# Patient Record
Sex: Male | Born: 1990 | Race: White | Hispanic: No | Marital: Married | Smoking: Current some day smoker
Health system: Southern US, Community
[De-identification: ages and names within clinical notes are randomized; demographics above are authoritative.]

## PROBLEM LIST (undated history)

## (undated) DIAGNOSIS — F32A Depression, unspecified: Secondary | ICD-10-CM

## (undated) DIAGNOSIS — F909 Attention-deficit hyperactivity disorder, unspecified type: Secondary | ICD-10-CM

## (undated) HISTORY — DX: Depression, unspecified: F32.A

## (undated) HISTORY — DX: Attention-deficit hyperactivity disorder, unspecified type: F90.9

## (undated) HISTORY — PX: WISDOM TOOTH EXTRACTION: SHX21

---

## 2017-11-04 ENCOUNTER — Other Ambulatory Visit: Payer: Self-pay | Admitting: Family Medicine

## 2017-11-04 DIAGNOSIS — K4090 Unilateral inguinal hernia, without obstruction or gangrene, not specified as recurrent: Secondary | ICD-10-CM

## 2017-11-10 ENCOUNTER — Other Ambulatory Visit: Payer: Self-pay

## 2017-11-12 ENCOUNTER — Ambulatory Visit
Admission: RE | Admit: 2017-11-12 | Discharge: 2017-11-12 | Disposition: A | Discharge status: Home / Self Care | Payer: 59 | Source: Ambulatory Visit | Attending: Family Medicine | Admitting: Family Medicine

## 2017-11-12 DIAGNOSIS — K4090 Unilateral inguinal hernia, without obstruction or gangrene, not specified as recurrent: Secondary | ICD-10-CM

## 2017-11-12 MED ORDER — IOPAMIDOL (ISOVUE-300) INJECTION 61%
125.0000 mL | Freq: Once | INTRAVENOUS | Status: AC | PRN
  Administered 2017-11-12: 125 mL via INTRAVENOUS

## 2018-04-23 ENCOUNTER — Ambulatory Visit
Admission: RE | Admit: 2018-04-23 | Discharge: 2018-04-23 | Disposition: A | Discharge status: Home / Self Care | Payer: No Typology Code available for payment source | Source: Ambulatory Visit | Attending: Nurse Practitioner | Admitting: Nurse Practitioner

## 2018-04-23 ENCOUNTER — Other Ambulatory Visit: Payer: Self-pay | Admitting: Nurse Practitioner

## 2018-04-23 DIAGNOSIS — Z021 Encounter for pre-employment examination: Secondary | ICD-10-CM

## 2019-09-02 ENCOUNTER — Other Ambulatory Visit: Payer: Self-pay

## 2019-09-02 DIAGNOSIS — Z20822 Contact with and (suspected) exposure to covid-19: Secondary | ICD-10-CM

## 2019-09-04 LAB — NOVEL CORONAVIRUS, NAA: SARS-CoV-2, NAA: NOT DETECTED

## 2019-10-22 IMAGING — CT CT ABD-PELV W/ CM
3 of 4 series · 12 of 36 positions shown, 19 images · IV contrast (READICAT/WATER & [ID] ISOVUE 300)
Comparison: None

CLINICAL DATA: RIGHT groin pain for 1.5 weeks question hernia,
history constipation

EXAM:
CT ABDOMEN AND PELVIS WITH CONTRAST
TECHNIQUE: Multidetector CT imaging of the abdomen and pelvis was performed
using the standard protocol following bolus administration of
intravenous contrast. Sagittal and coronal MPR images reconstructed
from axial data set.
CONTRAST:  Dilute oral contrast.  100 cc Rsovue-PPP IV.

[Series 3: abd/pelvis with · axial · 0.78mm/px · z∈[-414,-34]mm · 9 of 94 slices shown, 15 images]
[im 10/94  soft-tissue]
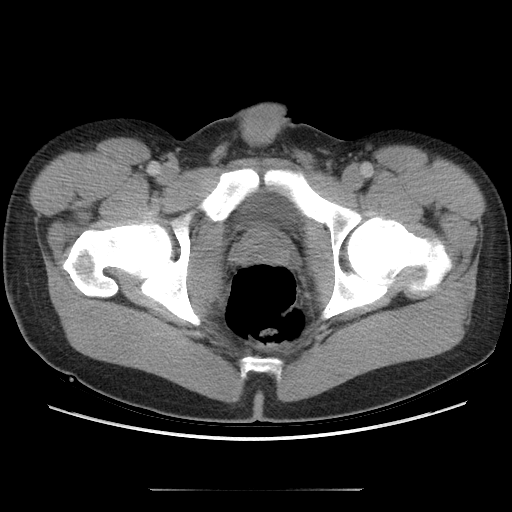
[im 10/94  bone]
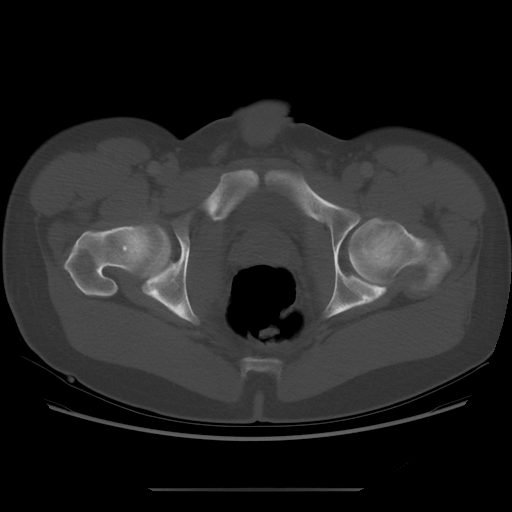
[im 19/94  soft-tissue]
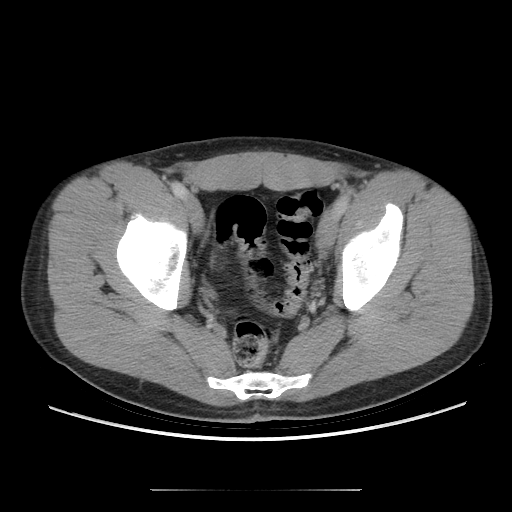
[im 28/94  soft-tissue]
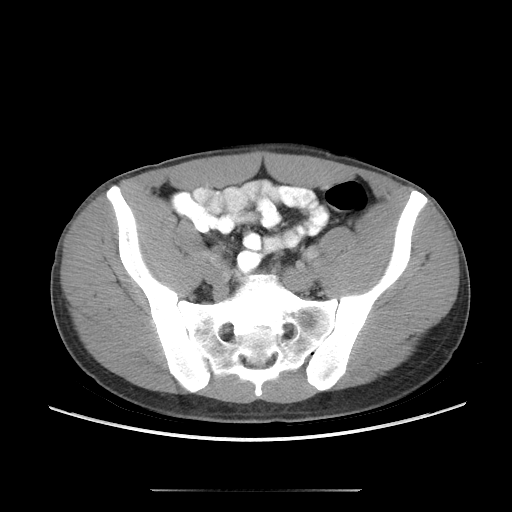
[im 38/94  soft-tissue]
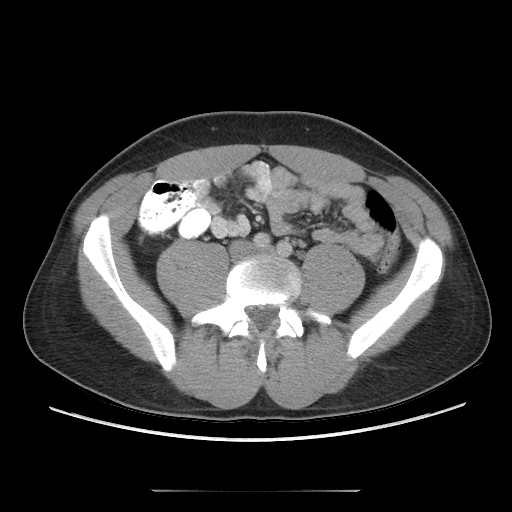
[im 47/94  soft-tissue]
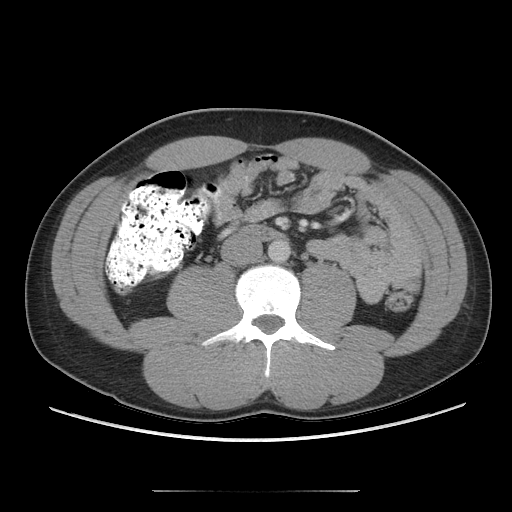
[im 56/94  soft-tissue]
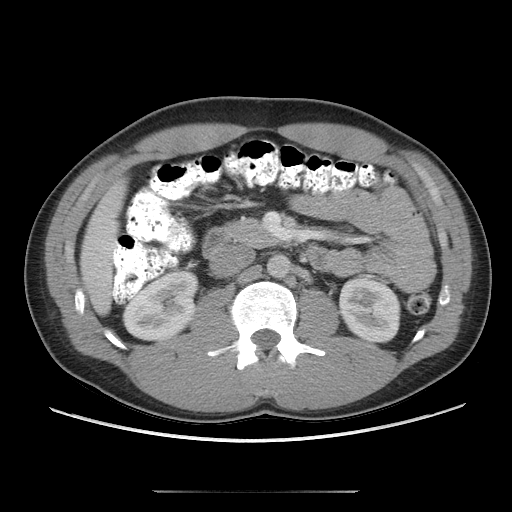
[im 56/94  lung]
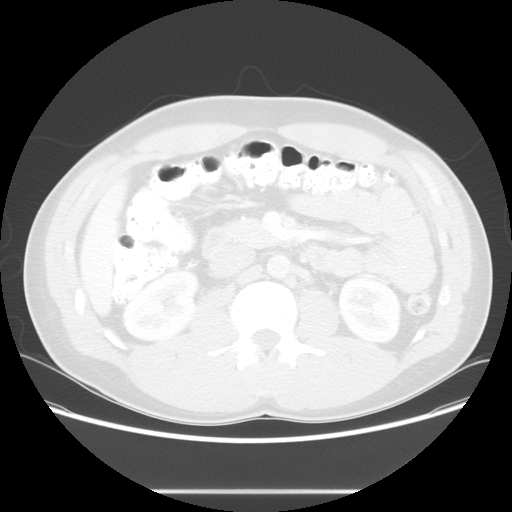
[im 66/94  soft-tissue]
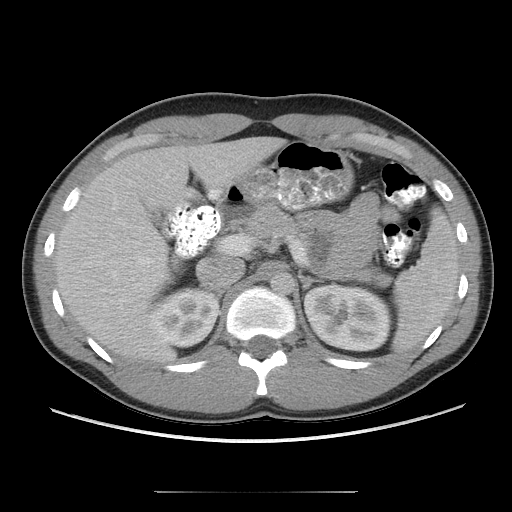
[im 66/94  lung]
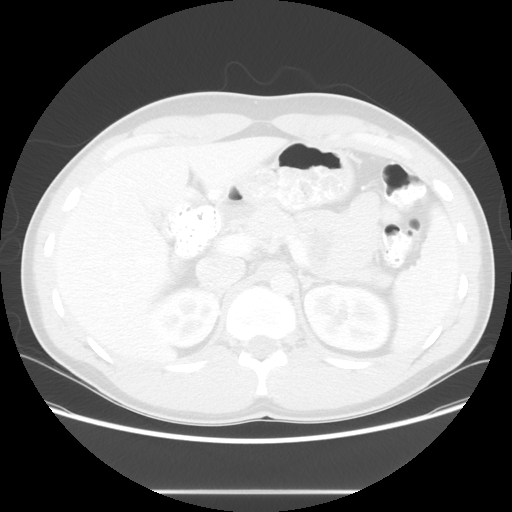
[im 75/94  soft-tissue]
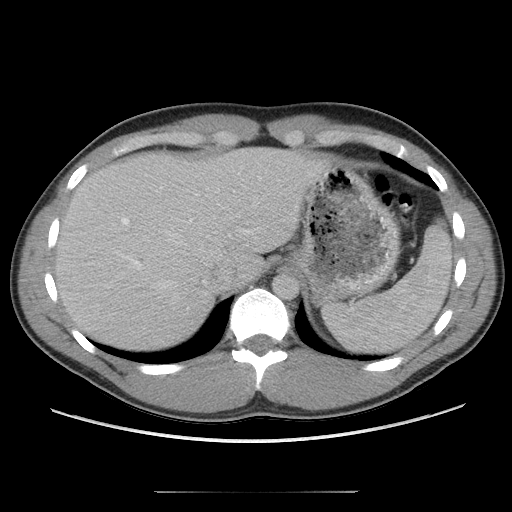
[im 75/94  lung]
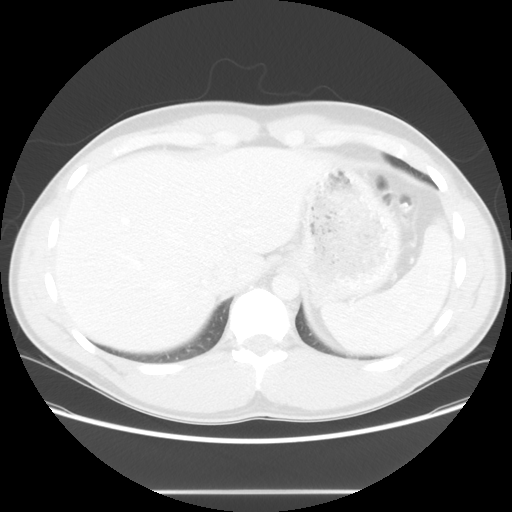
[im 84/94  soft-tissue]
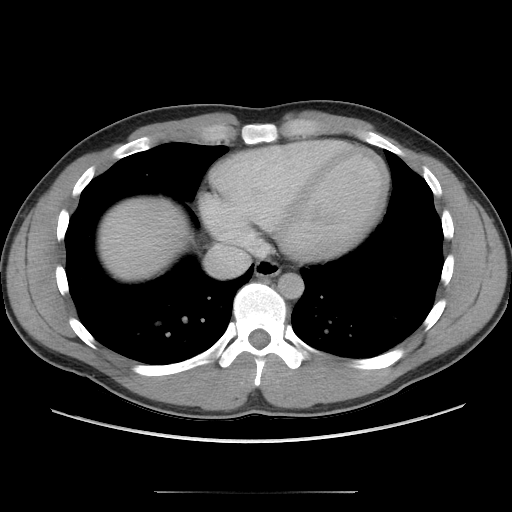
[im 84/94  lung]
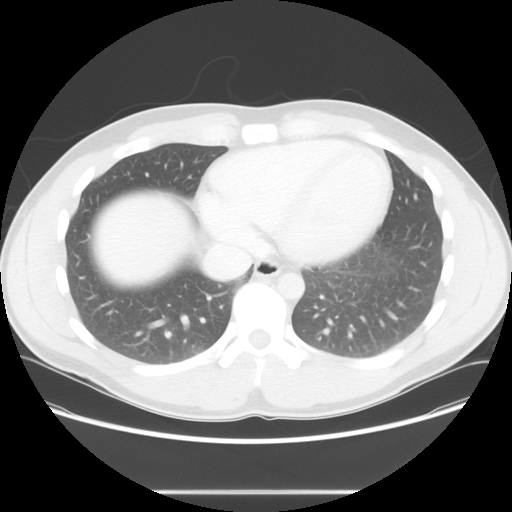
[im 84/94  bone]
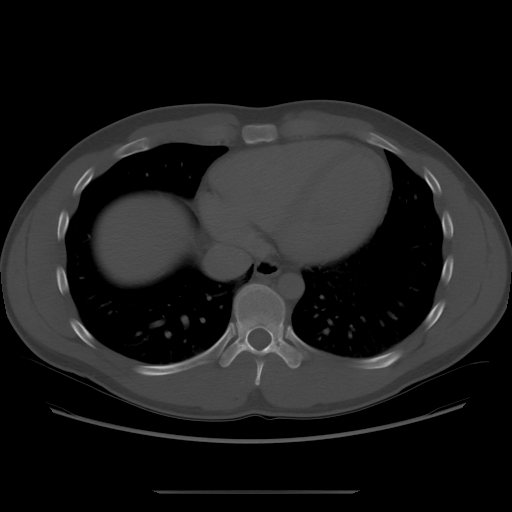

[Series 601: coronal body · coronal · 0.95mm/px · 1 of 112 slices shown, 2 images]
[im 38/112  soft-tissue]
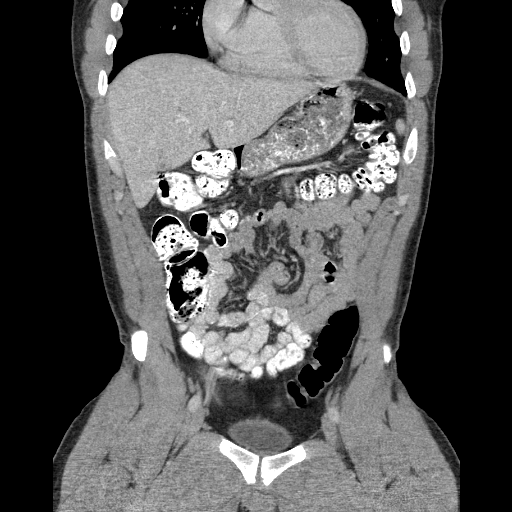
[im 38/112  bone]
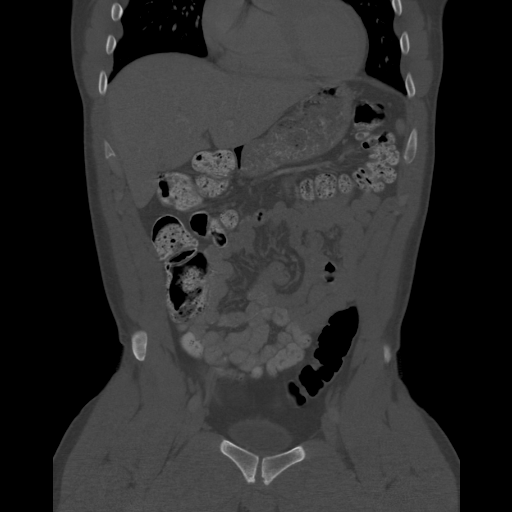

[Series 602: sagittal body · sagittal · 0.95mm/px · 2 of 161 slices shown]
[im 19/161  soft-tissue]
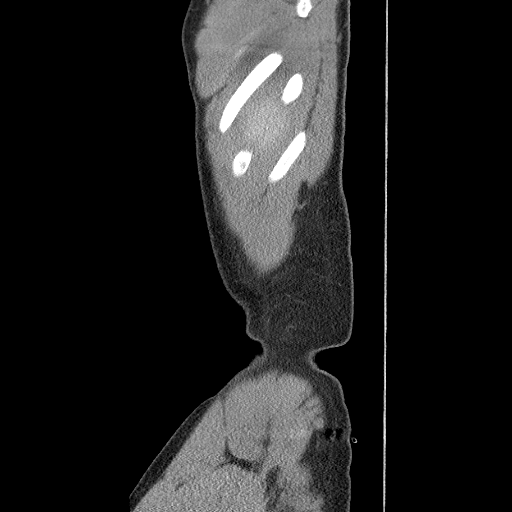
[im 38/161  soft-tissue]
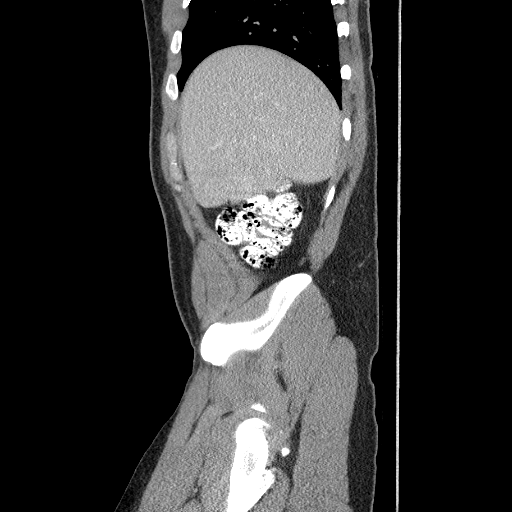

[12 of 36 positions shown; findings below may reference images not displayed]

FINDINGS: Lower chest: Lung bases clear

Hepatobiliary: Contracted gallbladder.  Liver normal appearance.

Pancreas: Normal appearance

Spleen: Normal appearance

Adrenals/Urinary Tract: Adrenal glands normal appearance. Kidneys,
ureters and bladder normal appearance

Stomach/Bowel: Stomach contains food debris and minimal contrast.
Normal appendix. Large and small bowel loops normal appearance.

Vascular/Lymphatic: Vascular structures unremarkable. No adenopathy.

Reproductive: Unremarkable prostate gland and seminal vesicles

Other: No free air or free fluid. Tiny umbilical hernia containing
fat. No definite inguinal hernia seen. No acute inflammatory
process.

Musculoskeletal: Osseous structures unremarkable. Muscular planes
symmetric.
IMPRESSION: Tiny umbilical hernia containing fat.

Otherwise normal exam.

## 2019-11-08 DIAGNOSIS — R229 Localized swelling, mass and lump, unspecified: Secondary | ICD-10-CM | POA: Insufficient documentation

## 2020-03-31 IMAGING — CR DG CHEST 1V
1 series · 1 of 1 positions shown · non-contrast
Comparison: None.

CLINICAL DATA: Pre-employment physical examination

EXAM:
CHEST  1 VIEW

[w chest pa]
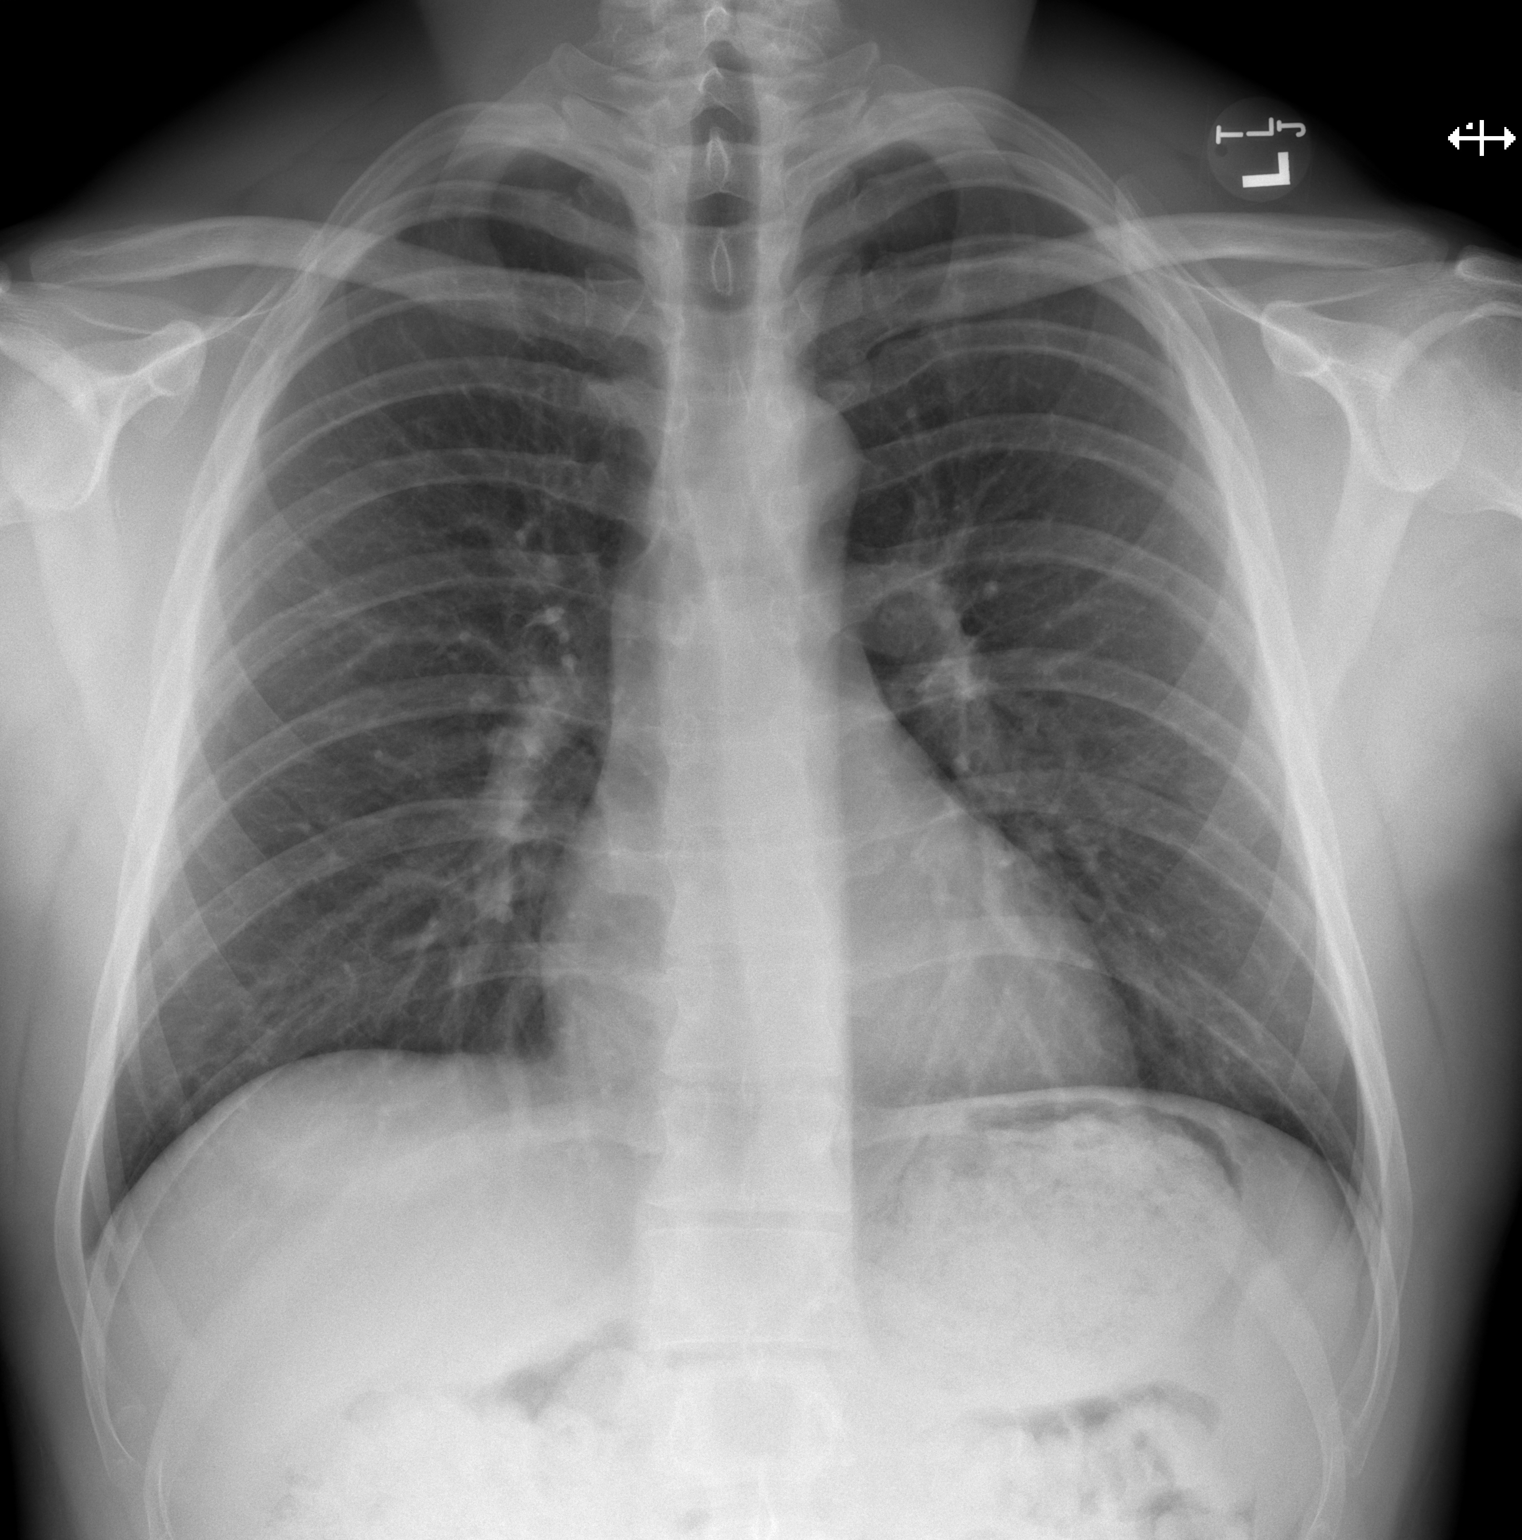

[1 of 1 positions shown; findings below may reference images not displayed]

FINDINGS: Lungs are clear. Heart size and pulmonary vascularity are normal. No
adenopathy. No bone lesions.
IMPRESSION: No abnormality noted.

## 2022-03-13 ENCOUNTER — Ambulatory Visit: Payer: 59 | Admitting: Family

## 2022-03-13 ENCOUNTER — Encounter: Payer: Self-pay | Admitting: Family

## 2022-03-13 VITALS — BP 122/66 | HR 60 | Temp 98.7°F | Ht 74.0 in | Wt 217.0 lb

## 2022-03-13 DIAGNOSIS — R5383 Other fatigue: Secondary | ICD-10-CM

## 2022-03-13 DIAGNOSIS — Z0000 Encounter for general adult medical examination without abnormal findings: Secondary | ICD-10-CM | POA: Diagnosis not present

## 2022-03-13 NOTE — Patient Instructions (Addendum)
Welcome to [facility], It was a pleasure meeting you today!  Go to the lab for blood work today.   Schedule your testosterone level for one morning before 9am, non-fasting. See the attached handout regarding how to fight fatigue.  Have a great rest of the week!     PLEASE NOTE: If you had any LAB tests please let us know if you have not heard back within a few days. You may see your results on MyChart before we have a chance to review them but we will give you a call once they are reviewed by [place]. If we ordered any REFERRALS today, please let us know if you have not heard from their office within the next week.  Let us know through MyChart if you are needing REFILLS, or have your pharmacy send [place] the request. You can also use MyChart to communicate with me or any office staff.

## 2022-03-13 NOTE — Progress Notes (Signed)
3 Phone: [phone]   Subjective:  Patient 31 y.o. male presenting for annual physical.  Chief Complaint  Patient presents with   Annual Exam    Not fasting W/ labs. Would like to check Testosterone   Fatigue    Pt c/o fatigue and no energy some days even on his days off. Present for a year and a half or 2 years.     See problem oriented charting- ROS- full  review of systems was completed and negative   The following were reviewed and entered/updated in epic: Past Medical History:  Diagnosis Date   ADHD    Depression    Patient Active Problem List   Diagnosis Date Noted   Subcutaneous mass 11/08/2019   Past Surgical History:  Procedure Laterality Date   WISDOM TOOTH EXTRACTION      History reviewed. No pertinent family history.  Medications- reviewed and updated Current Outpatient Medications  Medication Sig Dispense Refill   CONCERTA 54 MG CR tablet Take 54 mg by mouth every morning.     methylphenidate (RITALIN) 10 MG tablet Take 10 mg by mouth daily.     No current facility-administered medications for this visit.    Allergies-reviewed and updated No Known Allergies  Social History   Social History Narrative   Not on file   Objective  Objective:  BP 122/66 (BP Location: Left Arm, Patient Position: Sitting, Cuff Size: Large)   Pulse 60   Temp 98.7 F (37.1 C) (Temporal)   Ht 6\' 2"  (1.88 m)   Wt 217 lb (98.4 kg)   SpO2 99%   BMI 27.86 kg/m  Physical Exam Vitals and nursing note reviewed.  Constitutional:      General: He is not in acute distress.    Appearance: Normal appearance.  HENT:     Head: Normocephalic.     Right Ear: Tympanic membrane and external ear normal.     Left Ear: Tympanic membrane and external ear normal.     Nose: Nose normal.     Mouth/Throat:     Mouth: Mucous membranes are moist.  Eyes:     Extraocular Movements: Extraocular movements intact.  Cardiovascular:     Rate and Rhythm: Regular rhythm. Bradycardia  present.  Pulmonary:     Effort: Pulmonary effort is normal.     Breath sounds: Normal breath sounds.  Abdominal:     General: Abdomen is flat. There is no distension.     Palpations: Abdomen is soft.     Tenderness: There is no abdominal tenderness.  Musculoskeletal:        General: Normal range of motion.     Cervical back: Normal range of motion.  Skin:    General: Skin is warm and dry.  Neurological:     Mental Status: He is alert and oriented to person, place, and time.  Psychiatric:        Mood and Affect: Mood normal.        Behavior: Behavior normal.        Judgment: Judgment normal.     Assessment and Plan   Health Maintenance counseling: 1. Anticipatory guidance: Patient counseled regarding regular dental exams q6 months, eye exams yearly, avoiding smoking and second hand smoke, limiting alcohol to 2 beverages per day.   2. Risk factor reduction:  Advised patient of need for regular exercise and diet rich in fruits and vegetables to reduce risk of heart attack and stroke.    Wt Readings from Last 3 Encounters:  03/13/22 217 lb (98.4 kg)   3. Immunizations/screenings/ancillary studies Immunization History  Administered Date(s) Administered   Hepatitis B, adult 07/30/2017   PPD Test 07/30/2017   Health Maintenance Due  Topic Date Due   COVID-19 Vaccine (1) Never done   HIV Screening  Never done   Hepatitis C Screening  Never done   TETANUS/TDAP  Never done    4. Skin cancer screening-  advised regular sunscreen use. Denies worrisome, changing, or new skin lesions.  5. Smoking associated screening: non- smoker  6. STD screening - N/A 7. Alcohol screening: 3-4 per week   Problem List Items Addressed This Visit   None Visit Diagnoses     Annual physical exam    -  Primary   Relevant Orders   Comprehensive metabolic panel   TSH   Lipid panel   CBC with Differential/Platelet   Other fatigue    - exercises most days, sleeping well, works physical job,  drinks large amount of caffeine qd in addition to taking stimulant medication, advised on reducing. Low heart rate, 54bpm during exam, denies dizziness or lightheadedness, but d/t age, exercise will continue to monitor.    Relevant Orders   Vitamin D (25 hydroxy)     Recommended follow up: Return for lab only appointment before 9am for testosterone check. No future appointments.   Lab/Order associations: non- fasting   Jeanie Sewer, NP

## 2022-03-14 ENCOUNTER — Other Ambulatory Visit: Payer: 59

## 2022-03-14 ENCOUNTER — Other Ambulatory Visit: Payer: Self-pay

## 2022-03-14 DIAGNOSIS — R5383 Other fatigue: Secondary | ICD-10-CM

## 2022-03-14 LAB — CBC WITH DIFFERENTIAL/PLATELET
Basophils Absolute: 0.1 10*3/uL (ref 0.0–0.1)
Basophils Relative: 1 % (ref 0.0–3.0)
Eosinophils Absolute: 0.3 10*3/uL (ref 0.0–0.7)
Eosinophils Relative: 5.7 % — ABNORMAL HIGH (ref 0.0–5.0)
HCT: 41.9 % (ref 39.0–52.0)
Hemoglobin: 14.5 g/dL (ref 13.0–17.0)
Lymphocytes Relative: 30.2 % (ref 12.0–46.0)
Lymphs Abs: 1.7 10*3/uL (ref 0.7–4.0)
MCHC: 34.7 g/dL (ref 30.0–36.0)
MCV: 89.3 fl (ref 78.0–100.0)
Monocytes Absolute: 0.5 10*3/uL (ref 0.1–1.0)
Monocytes Relative: 8.3 % (ref 3.0–12.0)
Neutro Abs: 3.1 10*3/uL (ref 1.4–7.7)
Neutrophils Relative %: 54.8 % (ref 43.0–77.0)
Platelets: 232 10*3/uL (ref 150.0–400.0)
RBC: 4.69 Mil/uL (ref 4.22–5.81)
RDW: 12.9 % (ref 11.5–15.5)
WBC: 5.6 10*3/uL (ref 4.0–10.5)

## 2022-03-14 LAB — LIPID PANEL
Cholesterol: 177 mg/dL (ref 0–200)
HDL: 72.1 mg/dL (ref >39.00)
LDL Cholesterol: 87 mg/dL (ref 0–99)
NonHDL: 104.72
Total CHOL/HDL Ratio: 2
Triglycerides: 87 mg/dL (ref 0.0–149.0)
VLDL: 17.4 mg/dL (ref 0.0–40.0)

## 2022-03-14 LAB — COMPREHENSIVE METABOLIC PANEL
ALT: 25 U/L (ref 0–53)
AST: 21 U/L (ref 0–37)
Albumin: 4.7 g/dL (ref 3.5–5.2)
Alkaline Phosphatase: 39 U/L (ref 39–117)
BUN: 11 mg/dL (ref 6–23)
CO2: 28 mEq/L (ref 19–32)
Calcium: 9.6 mg/dL (ref 8.4–10.5)
Chloride: 103 mEq/L (ref 96–112)
Creatinine, Ser: 1.18 mg/dL (ref 0.40–1.50)
GFR: 83.78 mL/min (ref >60.00)
Glucose, Bld: 77 mg/dL (ref 70–99)
Potassium: 3.8 mEq/L (ref 3.5–5.1)
Sodium: 141 mEq/L (ref 135–145)
Total Bilirubin: 0.6 mg/dL (ref 0.2–1.2)
Total Protein: 7 g/dL (ref 6.0–8.3)

## 2022-03-14 LAB — TSH: TSH: 1.57 u[IU]/mL (ref 0.35–5.50)

## 2022-03-14 LAB — VITAMIN D 25 HYDROXY (VIT D DEFICIENCY, FRACTURES): VITD: 23.15 ng/mL — ABNORMAL LOW (ref 30.00–100.00)

## 2022-03-15 ENCOUNTER — Other Ambulatory Visit (INDEPENDENT_AMBULATORY_CARE_PROVIDER_SITE_OTHER): Payer: 59

## 2022-03-15 DIAGNOSIS — R5383 Other fatigue: Secondary | ICD-10-CM | POA: Diagnosis not present

## 2022-03-15 LAB — TESTOSTERONE: Testosterone: 533.72 ng/dL (ref 300.00–890.00)

## 2022-03-17 NOTE — Progress Notes (Signed)
Hi Phineas,  All of your labs are within normal range.  Glucose (sugar), electrolytes, kidney and liver function, blood count, thyroid, and cholesterol numbers are all good.  Your testosterone is also in very good range. The slight elevation in your Eosinophils just indicates you are having some mild allergy symptoms. Keep up the good work with controlling your diet and continue to try and shoot for 30 minutes of exercise daily!

## 2022-07-06 ENCOUNTER — Other Ambulatory Visit: Payer: Self-pay

## 2022-07-06 ENCOUNTER — Emergency Department (HOSPITAL_BASED_OUTPATIENT_CLINIC_OR_DEPARTMENT_OTHER): Payer: No Typology Code available for payment source | Admitting: Radiology

## 2022-07-06 DIAGNOSIS — M25512 Pain in left shoulder: Secondary | ICD-10-CM | POA: Diagnosis not present

## 2022-07-06 DIAGNOSIS — Y990 Civilian activity done for income or pay: Secondary | ICD-10-CM | POA: Diagnosis not present

## 2022-07-06 DIAGNOSIS — W19XXXA Unspecified fall, initial encounter: Secondary | ICD-10-CM | POA: Insufficient documentation

## 2022-07-06 DIAGNOSIS — M25552 Pain in left hip: Secondary | ICD-10-CM | POA: Insufficient documentation
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
## 2022-07-07 ENCOUNTER — Emergency Department (HOSPITAL_BASED_OUTPATIENT_CLINIC_OR_DEPARTMENT_OTHER)
Admission: EM | Admit: 2022-07-07 | Discharge: 2022-07-07 | Disposition: A | Discharge status: Home / Self Care | Payer: No Typology Code available for payment source | Attending: Emergency Medicine | Admitting: Emergency Medicine

## 2022-07-07 DIAGNOSIS — M25512 Pain in left shoulder: Secondary | ICD-10-CM

## 2022-07-07 DIAGNOSIS — M25552 Pain in left hip: Secondary | ICD-10-CM

## 2022-07-07 MED ORDER — KETOROLAC TROMETHAMINE 60 MG/2ML IM SOLN
30.0000 mg | Freq: Once | INTRAMUSCULAR | Status: AC
  Administered 2022-07-07: 30 mg via INTRAMUSCULAR
  Filled 2022-07-07: qty 2

## 2022-07-07 MED ORDER — NAPROXEN 375 MG PO TABS: 375.0000 mg | ORAL_TABLET | Freq: Two times a day (BID) | ORAL | 0 refills | Status: DC

## 2022-07-07 MED ORDER — LIDOCAINE 5 % EX PTCH: 1.0000 | MEDICATED_PATCH | CUTANEOUS | 0 refills | Status: AC

## 2022-07-07 NOTE — ED Provider Notes (Signed)
[facility] EMERGENCY DEPT Provider Note   CSN: [id number] Arrival date & time: 07/06/22  2123     History  Chief Complaint  Patient presents with   Fall   Knee Pain   Shoulder Injury   Work Related Injury    Walter Robinson is a 31 y.o. male.  The history is provided by the patient.  Fall This is a new problem. The current episode started 6 to 12 hours ago. The problem occurs constantly. The problem has been resolved. Pertinent negatives include no chest pain, no abdominal pain, no headaches and no shortness of breath. Nothing aggravates the symptoms. Nothing relieves the symptoms. He has tried nothing for the symptoms. The treatment provided no relief.  Knee Pain Lower extremity pain location: fall onto left shoulder and hip with referred pain to the knee. Time since incident:  7 hours Pain details:    Quality:  Aching   Severity:  Moderate   Onset quality:  Sudden   Duration:  12 hours   Timing:  Constant   Progression:  Unchanged Chronicity:  New Relieved by:  Nothing Worsened by:  Nothing Ineffective treatments:  None tried Associated symptoms: no back pain, no decreased ROM and no fatigue   Risk factors: no concern for non-accidental trauma   Shoulder Injury This is a new problem. The current episode started 6 to 12 hours ago. The problem occurs constantly. Pertinent negatives include no chest pain, no abdominal pain, no headaches and no shortness of breath. Nothing aggravates the symptoms. Nothing relieves the symptoms. He has tried nothing for the symptoms. The treatment provided no relief.       Home Medications Prior to Admission medications   Medication Sig Start Date End Date Taking? Authorizing Provider  lidocaine (LIDODERM) 5 % Place 1 patch onto the skin daily. Remove & Discard patch within 12 hours or as directed by MD 07/07/22  Yes Elah Avellino, MD  naproxen (NAPROSYN) 375 MG tablet Take 1 tablet (375 mg total) by mouth 2 (two) times  daily. 07/07/22  Yes Ilija Maxim, MD  CONCERTA 54 MG CR tablet Take 54 mg by mouth every morning. 03/05/22   [provider]  methylphenidate (RITALIN) 10 MG tablet Take 10 mg by mouth daily. 03/05/22   [provider]      Allergies    Patient has no known allergies.    Review of Systems   Review of Systems  Constitutional:  Negative for fatigue.  Eyes:  Negative for redness.  Respiratory:  Negative for shortness of breath.   Cardiovascular:  Negative for chest pain.  Gastrointestinal:  Negative for abdominal pain.  Musculoskeletal:  Negative for back pain.  Neurological:  Negative for headaches.  All other systems reviewed and are negative.   Physical Exam Updated Vital Signs BP 121/64   Pulse 63   Temp 97.7 F (36.5 C) (Oral)   Resp 16   Ht 6\' 2"  (1.88 m)   Wt 97.5 kg   SpO2 100%   BMI 27.60 kg/m  Physical Exam Vitals and nursing note reviewed.  Constitutional:      General: He is not in acute distress.    Appearance: Normal appearance. He is well-developed. He is not diaphoretic.  HENT:     Head: Normocephalic and atraumatic.     Nose: Nose normal.  Eyes:     Conjunctiva/sclera: Conjunctivae normal.     Pupils: Pupils are equal, round, and reactive to light.  Cardiovascular:  Rate and Rhythm: Normal rate and regular rhythm.  Pulmonary:     Effort: Pulmonary effort is normal.     Breath sounds: Normal breath sounds. No wheezing or rales.  Abdominal:     General: Bowel sounds are normal.     Palpations: Abdomen is soft.     Tenderness: There is no abdominal tenderness. There is no guarding or rebound.  Musculoskeletal:        General: Normal range of motion.     Left shoulder: Normal.     Left upper arm: Normal.     Left elbow: Normal.     Left forearm: Normal.     Left wrist: Normal. No snuff box tenderness.     Cervical back: Normal, normal range of motion and neck supple.     Thoracic back: Normal.     Lumbar back: Normal.      Left hip: Normal.     Left knee: Normal. No LCL laxity, MCL laxity, ACL laxity or PCL laxity.Normal meniscus and normal patellar mobility. Normal pulse.     Instability Tests: Anterior drawer test negative. Posterior drawer test negative.  Skin:    General: Skin is warm and dry.  Neurological:     Mental Status: He is alert and oriented to person, place, and time.     ED Results / Procedures / Treatments   Labs (all labs ordered are listed, but only abnormal results are displayed) Labs Reviewed - No data to display  EKG None  Radiology DG Shoulder Left Port  Result Date: 07/06/2022 CLINICAL DATA:  Shoulder pain after fall on pavement EXAM: LEFT SHOULDER COMPARISON:  None Available. FINDINGS: There is no evidence of fracture or dislocation. There is no evidence of arthropathy or other focal bone abnormality. Soft tissues are unremarkable. IMPRESSION: Negative. Electronically Signed   By: Minerva Fester M.D.   On: 07/06/2022 22:04   DG Knee Complete 4 Views Left  Result Date: 07/06/2022 CLINICAL DATA:  Knee pain after fall on pavement EXAM: LEFT KNEE - COMPLETE 4+ VIEW COMPARISON:  None Available. FINDINGS: No evidence of fracture, dislocation, or joint effusion. No evidence of arthropathy or other focal bone abnormality. Soft tissues are unremarkable. IMPRESSION: Negative. Electronically Signed   By: Minerva Fester M.D.   On: 07/06/2022 22:03   DG Hip Unilat With Pelvis 2-3 Views Left  Result Date: 07/06/2022 CLINICAL DATA:  Hip pain after fall on pavement EXAM: DG HIP (WITH OR WITHOUT PELVIS) 2-3V LEFT COMPARISON:  None Available. FINDINGS: There is no evidence of hip fracture or dislocation. There is no evidence of arthropathy or other focal bone abnormality. IMPRESSION: Negative. Electronically Signed   By: Minerva Fester M.D.   On: 07/06/2022 22:03    Procedures Procedures    Medications Ordered in ED Medications  ketorolac (TORADOL) injection 30 mg (30 mg Intramuscular  Given 07/07/22 0421)    ED Course/ Medical Decision Making/ A&P                           Medical Decision Making Patient who fell running after someone   Amount and/or Complexity of Data Reviewed Independent Historian: friend    Details: See above  External Data Reviewed: notes.    Details: Previous notes reviewed  Radiology: ordered and independent interpretation performed.    Details: Xray negative by me   Risk Prescription drug management. Risk Details: Negative Neers test of the right shoulder. Walking on the hip.  No bruising.  Ice, NSAIDs alternating with tylenol.  Stable for discharge strict return precautions.      Final Clinical Impression(s) / ED Diagnoses Final diagnoses:  Pain of left hip  Acute pain of left shoulder   Return for intractable cough, coughing up blood, fevers > 100.4 unrelieved by medication, shortness of breath, intractable vomiting, chest pain, shortness of breath, weakness, numbness, changes in speech, facial asymmetry, abdominal pain, passing out, Inability to tolerate liquids or food, cough, altered mental status or any concerns. No signs of systemic illness or infection. The patient is nontoxic-appearing on exam and vital signs are within normal limits.  I have reviewed the triage vital signs and the nursing notes. Pertinent labs & imaging results that were available during my care of the patient were reviewed by me and considered in my medical decision making (see chart for details). After history, exam, and medical workup I feel the patient has been appropriately medically screened and is safe for discharge home. Pertinent diagnoses were discussed with the patient. Patient was given return precautions.  Rx / DC Orders ED Discharge Orders          Ordered    naproxen (NAPROSYN) 375 MG tablet  2 times daily        07/07/22 0436    lidocaine (LIDODERM) 5 %  Every 24 hours        07/07/22 0436              Nieko Clarin, MD 07/07/22  0973

## 2022-07-08 ENCOUNTER — Encounter: Payer: Self-pay | Admitting: *Deleted

## 2022-09-26 ENCOUNTER — Encounter: Payer: Self-pay | Admitting: *Deleted

## 2023-05-17 ENCOUNTER — Other Ambulatory Visit: Payer: Self-pay

## 2023-05-17 ENCOUNTER — Emergency Department (HOSPITAL_COMMUNITY)
Admission: EM | Admit: 2023-05-17 | Discharge: 2023-05-17 | Disposition: A | Discharge status: Home / Self Care | Payer: Worker's Compensation | Attending: Emergency Medicine | Admitting: Emergency Medicine

## 2023-05-17 ENCOUNTER — Encounter (HOSPITAL_COMMUNITY): Payer: Self-pay

## 2023-05-17 DIAGNOSIS — S59911A Unspecified injury of right forearm, initial encounter: Secondary | ICD-10-CM | POA: Diagnosis present

## 2023-05-17 DIAGNOSIS — W503XXA Accidental bite by another person, initial encounter: Secondary | ICD-10-CM | POA: Diagnosis not present

## 2023-05-17 DIAGNOSIS — S51801A Unspecified open wound of right forearm, initial encounter: Secondary | ICD-10-CM | POA: Diagnosis not present

## 2023-05-17 DIAGNOSIS — Z7721 Contact with and (suspected) exposure to potentially hazardous body fluids: Secondary | ICD-10-CM | POA: Insufficient documentation

## 2023-05-17 LAB — COMPREHENSIVE METABOLIC PANEL
ALT: 38 U/L (ref 0–44)
AST: 26 U/L (ref 15–41)
Albumin: 4.4 g/dL (ref 3.5–5.0)
Alkaline Phosphatase: 42 U/L (ref 38–126)
Anion gap: 12 (ref 5–15)
BUN: 13 mg/dL (ref 6–20)
CO2: 22 mmol/L (ref 22–32)
Calcium: 9.1 mg/dL (ref 8.9–10.3)
Chloride: 101 mmol/L (ref 98–111)
Creatinine, Ser: 1.41 mg/dL — ABNORMAL HIGH (ref 0.61–1.24)
GFR, Estimated: >60 mL/min (ref >60)
Glucose, Bld: 97 mg/dL (ref 70–99)
Potassium: 3.6 mmol/L (ref 3.5–5.1)
Sodium: 135 mmol/L (ref 135–145)
Total Bilirubin: 1.2 mg/dL (ref 0.3–1.2)
Total Protein: 6.9 g/dL (ref 6.5–8.1)

## 2023-05-17 LAB — RAPID HIV SCREEN (HIV 1/2 AB+AG)
HIV 1/2 Antibodies: NONREACTIVE
HIV-1 P24 Antigen - HIV24: NONREACTIVE

## 2023-05-17 LAB — HEPATITIS PANEL, ACUTE
HCV Ab: NONREACTIVE
Hep A IgM: NONREACTIVE
Hep B C IgM: NONREACTIVE
Hepatitis B Surface Ag: NONREACTIVE

## 2023-05-17 LAB — HEPATITIS B SURFACE ANTIGEN: Hepatitis B Surface Ag: NONREACTIVE

## 2023-05-17 MED ORDER — AMOXICILLIN-POT CLAVULANATE 875-125 MG PO TABS: 1.0000 | ORAL_TABLET | Freq: Two times a day (BID) | ORAL | 0 refills | Status: DC

## 2023-05-17 MED ORDER — ELVITEG-COBIC-EMTRICIT-TENOFAF 150-150-200-10 MG PO TABS: 1.0000 | ORAL_TABLET | Freq: Every day | ORAL | 0 refills | Status: DC

## 2023-05-17 MED ORDER — ELVITEG-COBIC-EMTRICIT-TENOFAF 150-150-200-10 MG PREPACK
1.0000 | ORAL_TABLET | Freq: Once | ORAL | Status: AC
  Administered 2023-05-17: 1 via ORAL
  Filled 2023-05-17 (×2): qty 1
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
## 2024-10-27 ENCOUNTER — Ambulatory Visit (HOSPITAL_BASED_OUTPATIENT_CLINIC_OR_DEPARTMENT_OTHER): Admitting: Family Medicine

## 2024-10-27 ENCOUNTER — Encounter (HOSPITAL_BASED_OUTPATIENT_CLINIC_OR_DEPARTMENT_OTHER): Payer: Self-pay | Admitting: Family Medicine

## 2024-10-27 VITALS — BP 132/78 | HR 52 | Temp 98.5°F | Resp 18 | Ht 74.0 in | Wt 264.0 lb

## 2024-10-27 DIAGNOSIS — E559 Vitamin D deficiency, unspecified: Secondary | ICD-10-CM | POA: Diagnosis not present

## 2024-10-27 DIAGNOSIS — Z0000 Encounter for general adult medical examination without abnormal findings: Secondary | ICD-10-CM | POA: Insufficient documentation

## 2024-10-27 DIAGNOSIS — F909 Attention-deficit hyperactivity disorder, unspecified type: Secondary | ICD-10-CM | POA: Insufficient documentation

## 2024-10-27 NOTE — Assessment & Plan Note (Signed)
 Recommend continued follow-up with psychiatry regarding medication management.

## 2024-10-27 NOTE — Assessment & Plan Note (Signed)
 Noted on prior labs, we will check this with current lab panel to assess current status.  Not currently taking supplement, can determine if supplement would be recommended based on updated labs

## 2024-10-27 NOTE — Progress Notes (Signed)
 "  New Patient Office Visit  Subjective   Patient ID: Walter Robinson, male    DOB: 1990-10-20  Age: 34 y.o. MRN: [id number]  CC:  Chief Complaint  Patient presents with   Establish Care    HPI Walter Robinson presents to establish care Last PCP - provider through [facility] with psychiatry related to management of ADHD as well as mild underlying anxiety symptoms.  He currently takes stimulant medication and BuSpar to control each of these.  On chart review, he did have mild vitamin D  deficiency on prior labs which was a few years ago.  Has not had recent recheck, no current vitamin D  supplement utilized.  Patient is originally from [place], [place] . Has lived here for 7 years. He works as emergency planning/management officer. He enjoys spending time with family, has 32 yo at home.  Outpatient Encounter Medications as of 10/27/2024  Medication Sig   busPIRone (BUSPAR) 5 MG tablet Take 10 mg by mouth 2 (two) times daily.   CONCERTA 54 MG CR tablet Take 54 mg by mouth every morning.   methylphenidate (RITALIN) 10 MG tablet Take 10 mg by mouth daily. (Patient taking differently: Take 5 mg by mouth daily.)   lidocaine  (LIDODERM ) 5 % Place 1 patch onto the skin daily. Remove & Discard patch within 12 hours or as directed by MD   [DISCONTINUED] amoxicillin -clavulanate (AUGMENTIN ) 875-125 MG tablet Take 1 tablet by mouth every 12 (twelve) hours.   [DISCONTINUED] elvitegravir-cobicistat-emtricitabine-tenofovir (GENVOYA ) 150-150-200-10 MG TABS tablet Take 1 tablet by mouth daily with breakfast.   No facility-administered encounter medications on file as of 10/27/2024.    Past Medical History:  Diagnosis Date   ADHD    Depression     Past Surgical History:  Procedure Laterality Date   WISDOM TOOTH EXTRACTION      History reviewed. No pertinent family history.  Social History   Socioeconomic History   Marital status: Married    Spouse name: Not on file   Number of children: Not on file   Years  of education: Not on file   Highest education level: Not on file  Occupational History   Not on file  Tobacco Use   Smoking status: Former    Types: Cigarettes, Cigars    Passive exposure: Past   Smokeless tobacco: Never  Vaping Use   Vaping status: Never Used  Substance and Sexual Activity   Alcohol use: Yes    Alcohol/week: 3.0 standard drinks of alcohol    Types: 3 Cans of beer per week    Comment: occas.   Drug use: Not Currently    Types: Marijuana   Sexual activity: Yes    Birth control/protection: Condom    Comment: Partner on Birth control  Other Topics Concern   Not on file  Social History Narrative   Not on file   Social Drivers of Health   Tobacco Use: Medium Risk (10/27/2024)   Patient History    Smoking Tobacco Use: Former    Smokeless Tobacco Use: Never    Passive Exposure: Past  Physicist, Medical Strain: Not on file  Food Insecurity: Not on file  Transportation Needs: Not on file  Physical Activity: Not on file  Stress: Not on file  Social Connections: Unknown (02/25/2022)   Received from [facility]   Social Network    Social Network: Not on file  Intimate Partner Violence: Unknown (01/17/2022)   Received from [facility]   HITS    Physically Hurt: Not  on file    Insult or Talk Down To: Not on file    Threaten Physical Harm: Not on file    Scream or Curse: Not on file  Depression (PHQ2-9): Low Risk (10/27/2024)   Depression (PHQ2-9)    PHQ-2 Score: 0  Alcohol Screen: Not on file  Housing: Not on file  Utilities: Not on file  Health Literacy: Not on file   Objective   BP (!) 143/81 (BP Location: Left Arm, Patient Position: Sitting, Cuff Size: Large)   Pulse (!) 55   Temp 98.5 F (36.9 C) (Oral)   Resp 18   Ht 6' 2 (1.88 m)   Wt 264 lb (119.7 kg)   SpO2 100%   BMI 33.90 kg/m   Physical Exam Constitutional:      General: He is not in acute distress.    Appearance: He is obese.  HENT:     Head: Normocephalic and atraumatic.      Right Ear: Tympanic membrane, ear canal and external ear normal.     Left Ear: Tympanic membrane, ear canal and external ear normal.     Nose: Nose normal.     Mouth/Throat:     Mouth: Mucous membranes are moist.     Pharynx: Oropharynx is clear.  Eyes:     Extraocular Movements: Extraocular movements intact.     Conjunctiva/sclera: Conjunctivae normal.     Pupils: Pupils are equal, round, and reactive to light.  Cardiovascular:     Rate and Rhythm: Normal rate and regular rhythm.     Pulses: Normal pulses.     Heart sounds: Normal heart sounds. No murmur heard. Pulmonary:     Effort: Pulmonary effort is normal.     Breath sounds: Normal breath sounds. No wheezing.  Abdominal:     General: Bowel sounds are normal. There is no distension.     Palpations: Abdomen is soft.     Tenderness: There is no abdominal tenderness. There is no guarding.  Musculoskeletal:     Cervical back: Normal range of motion. No tenderness.  Skin:    General: Skin is warm.     Capillary Refill: Capillary refill takes less than 2 seconds.     Coloration: Skin is not jaundiced.     Findings: No rash.  Neurological:     General: No focal deficit present.     Mental Status: He is alert and oriented to person, place, and time.     Gait: Gait normal.  Psychiatric:        Mood and Affect: Mood normal.        Behavior: Behavior normal.    Assessment & Plan:   Wellness examination Assessment & Plan: Routine HCM labs ordered. HCM reviewed/discussed. Anticipatory guidance regarding healthy weight, lifestyle and choices given. Recommend healthy diet.  Recommend approximately 150 minutes/week of moderate intensity exercise Recommend regular dental and vision exams Always use seatbelt/lap and shoulder restraints Recommend using smoke alarms and checking batteries at least twice a year Recommend using sunscreen when outside Discussed immunization recommendations  Orders: -     CBC with  Differential/Platelet -     Comprehensive metabolic panel with GFR -     Hemoglobin A1c -     Lipid panel -     VITAMIN D  25 Hydroxy (Vit-D Deficiency, Fractures)  Attention deficit hyperactivity disorder (ADHD), unspecified ADHD type Assessment & Plan: Recommend continued follow-up with psychiatry regarding medication management.   Vitamin D  deficiency Assessment & Plan: Noted on  prior labs, we will check this with current lab panel to assess current status.  Not currently taking supplement, can determine if supplement would be recommended based on updated labs  Orders: -     VITAMIN D  25 Hydroxy (Vit-D Deficiency, Fractures)  Return in about 1 year (around 10/27/2025) for CPE.    ___________________________________________ Junko [place] de [place], MD, ABFM, CAQSM Primary Care and Sports [facility] "

## 2024-10-27 NOTE — Assessment & Plan Note (Signed)

## 2024-10-29 LAB — COMPREHENSIVE METABOLIC PANEL WITH GFR
ALT: 61 IU/L — ABNORMAL HIGH (ref 0–44)
AST: 25 IU/L (ref 0–40)
Albumin: 4.4 g/dL (ref 4.1–5.1)
Alkaline Phosphatase: 48 IU/L (ref 47–123)
BUN/Creatinine Ratio: 15 (ref 9–20)
BUN: 16 mg/dL (ref 6–20)
Bilirubin Total: 0.6 mg/dL (ref 0.0–1.2)
CO2: 23 mmol/L (ref 20–29)
Calcium: 9.1 mg/dL (ref 8.7–10.2)
Chloride: 100 mmol/L (ref 96–106)
Creatinine, Ser: 1.08 mg/dL (ref 0.76–1.27)
Globulin, Total: 2.1 g/dL (ref 1.5–4.5)
Glucose: 74 mg/dL (ref 70–99)
Potassium: 4.1 mmol/L (ref 3.5–5.2)
Sodium: 141 mmol/L (ref 134–144)
Total Protein: 6.5 g/dL (ref 6.0–8.5)
eGFR: 94 mL/min/1.73

## 2024-10-29 LAB — CBC WITH DIFFERENTIAL/PLATELET
Basophils Absolute: 0.1 x10E3/uL (ref 0.0–0.2)
Basos: 1 %
EOS (ABSOLUTE): 0.2 x10E3/uL (ref 0.0–0.4)
Eos: 2 %
Hematocrit: 47 % (ref 37.5–51.0)
Hemoglobin: 15.3 g/dL (ref 13.0–17.7)
Immature Grans (Abs): 0.2 x10E3/uL — ABNORMAL HIGH (ref 0.0–0.1)
Immature Granulocytes: 2 %
Lymphocytes Absolute: 4.7 x10E3/uL — ABNORMAL HIGH (ref 0.7–3.1)
Lymphs: 47 %
MCH: 30.1 pg (ref 26.6–33.0)
MCHC: 32.6 g/dL (ref 31.5–35.7)
MCV: 93 fL (ref 79–97)
Monocytes Absolute: 0.8 x10E3/uL (ref 0.1–0.9)
Monocytes: 8 %
Neutrophils Absolute: 4 x10E3/uL (ref 1.4–7.0)
Neutrophils: 40 %
Platelets: 289 x10E3/uL (ref 150–450)
RBC: 5.08 x10E6/uL (ref 4.14–5.80)
RDW: 13.3 % (ref 11.6–15.4)
WBC: 9.9 x10E3/uL (ref 3.4–10.8)

## 2024-10-29 LAB — LIPID PANEL
Chol/HDL Ratio: 2.8 ratio (ref 0.0–5.0)
Cholesterol, Total: 213 mg/dL — ABNORMAL HIGH (ref 100–199)
HDL: 77 mg/dL
LDL Chol Calc (NIH): 107 mg/dL — ABNORMAL HIGH (ref 0–99)
Triglycerides: 169 mg/dL — ABNORMAL HIGH (ref 0–149)
VLDL Cholesterol Cal: 29 mg/dL (ref 5–40)

## 2024-10-29 LAB — HEMOGLOBIN A1C
Est. average glucose Bld gHb Est-mCnc: 97 mg/dL
Hgb A1c MFr Bld: 5 % (ref 4.8–5.6)

## 2024-10-29 LAB — VITAMIN D 25 HYDROXY (VIT D DEFICIENCY, FRACTURES): Vit D, 25-Hydroxy: 11.7 ng/mL — ABNORMAL LOW (ref 30.0–100.0)
